# Patient Record
Sex: Female | Born: 1985 | Race: Black or African American | Hispanic: No | Marital: Single
Health system: Southern US, Academic
[De-identification: ages and names within clinical notes are randomized; demographics above are authoritative.]

## PROBLEM LIST (undated history)

## (undated) ENCOUNTER — Ambulatory Visit

## (undated) ENCOUNTER — Telehealth

## (undated) ENCOUNTER — Encounter: Attending: Dermatology | Primary: Dermatology

## (undated) ENCOUNTER — Encounter

## (undated) ENCOUNTER — Ambulatory Visit: Payer: MEDICAID | Attending: Dermatology | Primary: Dermatology

---

## 2017-12-27 ENCOUNTER — Ambulatory Visit: Admit: 2017-12-27 | Discharge: 2017-12-28 | Payer: MEDICAID | Attending: Dermatology | Primary: Dermatology

## 2017-12-27 ENCOUNTER — Other Ambulatory Visit: Admit: 2017-12-27 | Discharge: 2017-12-28 | Payer: MEDICAID

## 2017-12-27 ENCOUNTER — Ambulatory Visit
Admit: 2017-12-27 | Discharge: 2017-12-27 | Disposition: A | Discharge status: Home / Self Care | Payer: MEDICAID | Attending: Emergency Medicine

## 2017-12-27 ENCOUNTER — Emergency Department
Admit: 2017-12-27 | Discharge: 2017-12-27 | Disposition: A | Discharge status: Home / Self Care | Payer: MEDICAID | Attending: Emergency Medicine

## 2017-12-27 DIAGNOSIS — L400 Psoriasis vulgaris: Secondary | ICD-10-CM

## 2017-12-27 DIAGNOSIS — Z79899 Other long term (current) drug therapy: Principal | ICD-10-CM

## 2017-12-27 DIAGNOSIS — R42 Dizziness and giddiness: Secondary | ICD-10-CM

## 2017-12-27 DIAGNOSIS — R110 Nausea: Secondary | ICD-10-CM

## 2017-12-27 DIAGNOSIS — R51 Headache: Principal | ICD-10-CM

## 2017-12-27 MED ORDER — MECLIZINE 25 MG TABLET
ORAL_TABLET | Freq: Three times a day (TID) | ORAL | 0 refills | 0.00000 days | Status: CP | PRN
Start: 2017-12-27 — End: 2018-01-26

## 2017-12-27 MED ORDER — ONDANSETRON 4 MG DISINTEGRATING TABLET T-HOME
ORAL_TABLET | Freq: Three times a day (TID) | ORAL | 0 refills | 0.00000 days | Status: CP | PRN
Start: 2017-12-27 — End: 2018-01-26

## 2017-12-27 MED ORDER — SECUKINUMAB 150 MG/ML SUBCUTANEOUS PEN INJECTOR
SUBCUTANEOUS | 0 refills | 0.00000 days | Status: CP
Start: 2017-12-27 — End: 2018-08-31

## 2017-12-27 MED ORDER — SECUKINUMAB 150 MG/ML SUBCUTANEOUS SYRINGE
SUBCUTANEOUS | 0 refills | 0.00000 days | Status: CP
Start: 2017-12-27 — End: 2018-09-21
  Filled 2018-01-05: qty 8, 28d supply, fill #0

## 2017-12-27 MED ORDER — CALCIPOTRIENE 0.005 % TOPICAL OINTMENT
Freq: Two times a day (BID) | TOPICAL | 4 refills | 0.00000 days
Start: 2017-12-27 — End: 2018-08-31

## 2017-12-28 NOTE — Unmapped (Signed)
Telephoned patient and informed her of good lab results and that cosentyx was approved. She should hear from pharmacy in several days. If not, advised to reach back out to [place].

## 2017-12-29 LAB — QUANTIFERON TB GOLD PLUS
Lab: UNDETERMINED — AB
QUANTIFERON ANTIGEN 1 MINUS NIL: -0.01 [IU]/mL
QUANTIFERON MITOGEN: 0.32 [IU]/mL
QUANTIFERON TB GOLD PLUS: UNDETERMINED — AB

## 2017-12-29 NOTE — Unmapped (Signed)
Telephoned patient and discussed indeterminate result of TB test and that I would like to repeat this. She expressed understanding and will have lab ordered at [place].

## 2017-12-29 NOTE — Unmapped (Signed)
[facility] Specialty Medication Referral: PA APPROVED    Medication (Brand/Generic): COSENTYX    Initial Test Claim completed with resulted information below:  No PA required  Patient ABLE to fill at [facility]  Insurance Company:  [place] MEDICAID  Anticipated Copay: $3  Is anticipated copay with a copay card or grant? NO    As Co-pay is under $25 defined limit, per policy there will be no further investigation of need for financial assistance at this time unless patient requests. This referral has been communicated to the provider and handed off to the [facility] team for further processing and filling of prescribed medication.   ______________________________________________________________________  Please utilize this referral for viewing purposes as it will serve as the central location for all relevant documentation and updates.

## 2017-12-30 ENCOUNTER — Ambulatory Visit: Admit: 2017-12-30 | Discharge: 2017-12-31 | Payer: MEDICAID

## 2017-12-30 DIAGNOSIS — Z79899 Other long term (current) drug therapy: Principal | ICD-10-CM

## 2018-01-03 LAB — QUANTIFERON TB GOLD PLUS
Lab: NEGATIVE
QUANTIFERON ANTIGEN 2 MINUS NIL: -0.19 [IU]/mL
QUANTIFERON TB NIL VALUE: 0.29 [IU]/mL

## 2018-01-03 NOTE — Unmapped (Signed)
[facility]   Patient Onboarding/Medication Counseling    Ms.Tucciarone is a 32 y.o. 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    Medication: Cosenytx    Verified patient's date of birth / HIPAA.      Education Provided: ?    Dose/Administration discussed:  Inject the contents of 2 pen (300mg ) under the skin every week for weeks 0-4, then 2 pens (300mg ) every 4 weeks thereafter. This medication should be taken  without regard to food.  Stressed the importance of taking medication as prescribed and to contact provider if that changes at any time.  Discussed missed dose instructions.    Storage requirements: this medicine should be stored in the refrigerator.     Side effects / precautions discussed: Discussed common side effects, including increased risk of infection, GI upset, injection site reaction. If patient experiences severe skin reaction, fevers/chills they need to call the doctor.  Patient will receive a drug information handout with shipment.    Handling precautions / disposal reviewed:  Patient will dispose of needles in a sharps container or empty laundry detergent bottle.    Drug Interactions: other medications reviewed and up to date in Epic.  No drug interactions identified.    Comorbidities/Allergies: reviewed and up to date in Epic.    Verified therapy is appropriate and should continue      Delivery Information    Medication Assistance provided: Prior Authorization    Anticipated copay of $3 reviewed with patient. Verified delivery address in Epic.    Scheduled delivery date: 01/05/2018    Explained that medication will be delivered by courier. and this shipment will not require a signature.      Explained the services we provide at [facility] and that each month we would call to set up refills.  Stressed importance of returning phone calls so that we could ensure they receive their medications in time each month. Informed patient that we should be setting up refills 7-10 days prior to when they will run out of medication.  Informed patient that welcome packet will be sent.      Patient verbalized understanding of the above information as well as how to contact the pharmacy at [phone] option 4 with any questions/concerns.  The pharmacy is open Monday through Friday 8:30am-4:30pm.  A pharmacist is available 24/7 via pager to answer any clinical questions they may have.        Patient Specific Needs      ? Patient has no physical, cognitive, or cultural barriers.    ? Patient prefers to have medications discussed with  Patient     ? Patient is able to read and understand education materials at a high school level or above.    ? Patient's primary language is  English           Simonne Martinet, PY4    Meghan A. Katrinka Blazing, PharmD - Pharmacist   [facility]   [address], [place], [place] [address]   t [phone] - f [phone]

## 2018-01-04 NOTE — Unmapped (Signed)
Called and left message for patient TB Was negative, Patient verbalizes understanding.

## 2018-01-05 MED FILL — COSENTYX PEN 300 MG/2 PENS (150 MG/ML) SUBCUTANEOUS: 28 days supply | Qty: 8 | Fill #0 | Status: AC

## 2018-02-07 NOTE — Unmapped (Addendum)
[facility] Refill and Clinical Coordination Note  Medication(s): Cosentyx 150mg     Lisa Clements, DOB: 1985/11/29  Phone: [phone] (home) , Alternate phone contact: N/A  Shipping address: [address]  [address]  [place] [address]  Phone or address changes today?: No  All above HIPAA information verified.  Insurance changes? No    Completed refill and clinical call assessment today to schedule patient's medication shipment from the [facility] [phone]).      MEDICATION RECONCILIATION    Confirmed the medication and dosage are correct and have not changed: Yes, regimen is correct and unchanged.    Were there any changes to your medication(s) in the past month:  No, there are no changes reported at this time.    ADHERENCE    Is this medicine transplant or covered by Medicare Part B? No.    Not Applicable    Did you miss any doses in the past 4 weeks? No missed doses reported.  Adherence counseling provided? Not needed     SIDE EFFECT MANAGEMENT    Are you tolerating your medication?:  Lisa Clements reports tolerating the medication.  Side effect management discussed: None      Therapy is appropriate and should be continued.    Evidence of clinical benefit: See Epic note from 12/27/17      FINANCIAL/SHIPPING    Delivery Scheduled: Yes, Expected medication delivery date: 02/20/18     Medication will be delivered via Same Day Courier to the home address in [place].    Additional medications refilled: No additional medications/refills needed at this time.    The patient will receive a drug information handout for each medication shipped and additional FDA Medication Guides as required.      Kynlei did not have any additional questions at this time.    Delivery address confirmed in Epic.     We will follow up with patient monthly for standard refill processing and delivery.      Thank you,  Breck Coons Shared [facility] Specialty Pharmacist

## 2018-02-20 MED ORDER — SECUKINUMAB 150 MG/ML SUBCUTANEOUS PEN INJECTOR
SUBCUTANEOUS | 0 refills | 0.00000 days | Status: CP
Start: 2018-02-20 — End: 2018-04-18

## 2018-02-20 MED ORDER — COSENTYX PEN 300 MG/2 PENS (150 MG/ML) SUBCUTANEOUS
SUBCUTANEOUS | 0 refills | 0.00000 days | Status: CP
Start: 2018-02-20 — End: 2018-09-14
  Filled 2018-02-20: qty 2, 28d supply, fill #0

## 2018-02-20 MED FILL — COSENTYX PEN 300 MG/2 PENS (150 MG/ML) SUBCUTANEOUS: 28 days supply | Qty: 2 | Fill #0 | Status: AC

## 2018-03-13 NOTE — Unmapped (Signed)
Due to the holidays the delivery date of 1/2 is the earliest we can get it to her on a day she is available to accept delivery and a day the courier is running same day deliveries    [facility] Refill Coordination Note    Specialty Medication(s) to be Shipped:   Inflammatory Disorders: Cosentyx    Other medication(s) to be shipped: n/a     Lisa Clements, DOB: Aug 06, 1985  Phone: [phone] (home)       All above HIPAA information was verified with patient.     Completed refill call assessment today to schedule patient's medication shipment from the [facility] [phone]).       Specialty medication(s) and dose(s) confirmed: Regimen is correct and unchanged.   Changes to medications: [name] reports no changes reported at this time.  Changes to insurance: No  Questions for the pharmacist: No    The patient will receive a drug information handout for each medication shipped and additional FDA Medication Guides as required.      DISEASE/MEDICATION-SPECIFIC INFORMATION        For patients on injectable medications: Patient currently has 0 doses left.  Next injection is scheduled for by the end of next week- she will look and see when her last dose was.    SPECIALTY MEDICATION ADHERENCE     Medication Adherence    Patient reported X missed doses in the last month:  0  Specialty Medication:  cosentyx  Patient is on additional specialty medications:  No  Patient is on more than two specialty medications:  No  Any gaps in refill history greater than 2 weeks in the last 3 months:  no  Demonstrates understanding of importance of adherence:  yes  Informant:  patient  Reliability of informant:  reliable              Confirmed plan for next specialty medication refill:  delivery by pharmacy  Refills needed for supportive medications:  not needed          Refill Coordination    Has the Patients' Contact Information Changed:  No  Is the Shipping Address Different:  No cosentyx 150mg /ml: patient has 0 days of medication on hand      SHIPPING     Shipping address confirmed in Epic.     Delivery Scheduled: Yes, Expected medication delivery date: 1/2.     Medication will be delivered via Same Day Courier to the home address in [place].    Renette Butters   [facility] Specialty Technician

## 2018-03-21 MED FILL — COSENTYX PEN 300 MG/2 PENS (150 MG/ML) SUBCUTANEOUS: SUBCUTANEOUS | 28 days supply | Qty: 2 | Fill #1

## 2018-03-21 MED FILL — COSENTYX PEN 300 MG/2 PENS (150 MG/ML) SUBCUTANEOUS: 28 days supply | Qty: 2 | Fill #1 | Status: AC

## 2018-04-18 MED ORDER — SECUKINUMAB 150 MG/ML SUBCUTANEOUS PEN INJECTOR
SUBCUTANEOUS | 0 refills | 0.00000 days | Status: CP
Start: 2018-04-18 — End: 2018-06-07
  Filled 2018-04-26: qty 2, 28d supply, fill #0

## 2018-04-19 NOTE — Unmapped (Signed)
Provided refill x1, needs follow up appt prior to more refills.

## 2018-04-25 NOTE — Unmapped (Signed)
[facility] Refill Coordination Note    Specialty Medication(s) to be Shipped:   Inflammatory Disorders: Cosentyx    Other medication(s) to be shipped: n/a     Lisa Clements, DOB: [birth year]  Phone: [phone] (home)       All above HIPAA information was verified with patient.     Completed refill call assessment today to schedule patient's medication shipment from the [facility] [phone]).       Specialty medication(s) and dose(s) confirmed: Regimen is correct and unchanged.   Changes to medications: Lisa Clements reports no changes reported at this time.  Changes to insurance: No  Questions for the pharmacist: No    The patient will receive a drug information handout for each medication shipped and additional FDA Medication Guides as required.      DISEASE/MEDICATION-SPECIFIC INFORMATION        For patients on injectable medications: Patient currently has 0 doses left.  Next injection is scheduled for 04/28/18.    ADHERENCE              MEDICARE PART B DOCUMENTATION     Not Applicable    SHIPPING     Shipping address confirmed in Epic.     Delivery Scheduled: Yes, Expected medication delivery date: 04/25/18 via UPS or courier.     Medication will be delivered via Same Day Courier to the home address in Epic [place].    Unk Lightning   [facility] Specialty Technician

## 2018-04-26 MED FILL — COSENTYX PEN 300 MG/2 PENS (150 MG/ML) SUBCUTANEOUS: 28 days supply | Qty: 2 | Fill #0 | Status: AC

## 2018-05-16 NOTE — Unmapped (Signed)
[facility] Refill Coordination Note    Specialty Medication(s) to be Shipped:   Inflammatory Disorders: Cosentyx    Other medication(s) to be shipped: na     [address], DOB: Apr 12, 1985  Phone: [phone] (home)       All above HIPAA information was verified with patient.     Completed refill call assessment today to schedule patient's medication shipment from the [facility] [phone]).       Specialty medication(s) and dose(s) confirmed: Regimen is correct and unchanged.   Changes to medications: Cydnee reports no changes reported at this time.  Changes to insurance: No  Questions for the pharmacist: No    Confirmed patient received Welcome Packet with first shipment. The patient will receive a drug information handout for each medication shipped and additional FDA Medication Guides as required.       DISEASE/MEDICATION-SPECIFIC INFORMATION        N/A    SPECIALTY MEDICATION ADHERENCE     Medication Adherence    Patient reported X missed doses in the last month:  0  Specialty Medication:  cosentyx  Patient is on additional specialty medications:  No  Patient is on more than two specialty medications:  No  Any gaps in refill history greater than 2 weeks in the last 3 months:  no  Demonstrates understanding of importance of adherence:  yes  Informant:  patient  Reliability of informant:  reliable  Confirmed plan for next specialty medication refill:  delivery by pharmacy  Refills needed for supportive medications:  not needed          Refill Coordination    Has the Patients' Contact Information Changed:  No  Is the Shipping Address Different:  No           cosentyx injections patient has no medication on hand       SHIPPING     Shipping address confirmed in Epic.     Delivery Scheduled: Yes, Expected medication delivery date: 022820.     Medication will be delivered via Same Day Courier to the home address in Epic [place].    Gianno Volner D Lendon George   [facility] Shared [facility] Specialty Technician

## 2018-05-19 MED FILL — COSENTYX PEN 300 MG/2 PENS (150 MG/ML) SUBCUTANEOUS: 28 days supply | Qty: 2 | Fill #1 | Status: AC

## 2018-05-19 MED FILL — COSENTYX PEN 300 MG/2 PENS (150 MG/ML) SUBCUTANEOUS: 28 days supply | Qty: 2 | Fill #1

## 2018-06-07 DIAGNOSIS — L400 Psoriasis vulgaris: Principal | ICD-10-CM

## 2018-06-08 MED ORDER — SECUKINUMAB 150 MG/ML SUBCUTANEOUS PEN INJECTOR
SUBCUTANEOUS | 0 refills | 0.00000 days | Status: CP
Start: 2018-06-08 — End: 2018-08-31
  Filled 2018-07-12: qty 2, 28d supply, fill #0

## 2018-06-15 NOTE — Unmapped (Signed)
The [facility] has made a second and final attempt to reach this patient to refill the following medication:cosentyx.      We have Left voicemails on the following phone numbers: [phone].    Dates contacted: 3/18 and 3/26  Last scheduled delivery: 05/19/18    The patient may be at risk of non-compliance with this medication. The patient should call the [facility] at [phone] (option 4) to refill medication.    Shoaib Siefker D Administrator [facility] Specialty Technician

## 2018-07-12 MED FILL — COSENTYX PEN 300 MG/2 PENS (150 MG/ML) SUBCUTANEOUS: 28 days supply | Qty: 2 | Fill #0 | Status: AC

## 2018-07-12 NOTE — Unmapped (Signed)
Lisa Clements called in and reported she's starting to flare after missing her dose of Cosentyx last month (she was busy and forgot to call us back). I advised it was OK to get back on maintenance dose, and we could reassess if she still had flare in a month or two, we could reach out to the provider to potentially re-load.    [facility] Clinical Assessment & Refill Coordination Note    Lisa Clements, [name]: [birth year]  Phone: [phone] (home)     All above HIPAA information was verified with patient.     Specialty Medication(s):   Inflammatory Disorders: Cosentyx     Current Outpatient Medications   Medication Sig Dispense Refill   ??? buPROPion (WELLBUTRIN XL) 150 MG 24 hr tablet TAKE 1 TABLET BY MOUTH EVERY DAY FOR MOOD (NEED PRIMARY PAYER PER MEDICAID)  3   ??? calcipotriene (DOVONOX) 0.005 % ointment Apply topically Two (2) times a day. Can mix with cortisone and apply together BID    Large BSA 120 g 4   ??? halobetasol (ULTRAVATE) 0.05 % ointment APPLY TO AFFECTED AREA TWICE A DAY (NEED PRIMARY PAYER PER MEDICAID)  1   ??? secukinumab (COSENTYX) 150 mg/mL PnIj injection Inject 300 mg under the skin at weeks 0, 1, 2, 3, 4 10 mL 0   ??? secukinumab (COSENTYX) 150 mg/mL PnIj injection INJECT THE CONTENTS OF 2 PENS (300 MG) UNDER THE SKIN EVERY 28 DAYS 4 mL 0   ??? secukinumab 150 mg/mL Syrg Inject 300 mg under the skin once every 4 weeks 4 mL 0   ??? secukinumab 150 mg/mL Syrg Inject the contents of 2 syringes (300 mg) under the skin every twenty-eight (28) days. 4 mL 0     No current facility-administered medications for this visit.         Changes to medications: Lisa Clements reports no changes at this time.    No Known Allergies    Changes to allergies: No    SPECIALTY MEDICATION ADHERENCE     Cosentyx - 0 left  Medication Adherence    Patient reported X missed doses in the last month:  all  Specialty Medication:  Cosentyx          Specialty medication(s) dose(s) confirmed: Regimen is correct and unchanged.     Are there any concerns with adherence? Yes: missed last month's dose -got busy    Adherence counseling provided? deferred, patient understands need to stay on treatment regularly    CLINICAL MANAGEMENT AND INTERVENTION      Clinical Benefit Assessment:    Do you feel the medicine is effective or helping your condition? Yes    Clinical Benefit counseling provided? Not needed    Adverse Effects Assessment:    Are you experiencing any side effects? No    Are you experiencing difficulty administering your medicine? No    Quality of Life Assessment:    How many days over the past month did your psoriasis  keep you from your normal activities? For example, brushing your teeth or getting up in the morning. 0    Have you discussed this with your provider? Not needed    Therapy Appropriateness:    Is therapy appropriate? Yes, therapy is appropriate and should be continued    DISEASE/MEDICATION-SPECIFIC INFORMATION      For patients on injectable medications: Patient currently has 0 doses left.  Next injection is scheduled for asap.    PATIENT SPECIFIC NEEDS     ?  Does the patient have any physical, cognitive, or cultural barriers? No    ? Is the patient high risk? No     ? Does the patient require a Care Management Plan? No     ? Does the patient require physician intervention or other additional services (i.e. nutrition, smoking cessation, social work)? No      SHIPPING     Specialty Medication(s) to be Shipped:   Inflammatory Disorders: Cosentyx    Other medication(s) to be shipped: na     Changes to insurance: No    Delivery Scheduled: Yes, Expected medication delivery date: Wed, April 22.     Medication will be delivered via Same Day Courier to the confirmed home address in [facility].    The patient will receive a drug information handout for each medication shipped and additional FDA Medication Guides as required.  Verified that patient has previously received a Conservation officer, historic buildings.    Lanney Gins   [facility] Shared [facility] Specialty Pharmacist

## 2018-08-15 NOTE — Unmapped (Signed)
The [facility] has made a third and final attempt to reach this patient to refill the following medication:Cosentyx.      We have left voicemails on the following phone numbers: [phone].    Dates contacted: 5/14 5/19 5/26  Last scheduled delivery: 4/22    The patient may be at risk of non-compliance with this medication. The patient should call the [facility] at [phone] (option 4) to refill medication.    Travius Crochet D Administrator Shared [facility] Specialty Technician

## 2018-08-22 NOTE — Unmapped (Signed)
[facility] Refill Coordination Note    Specialty Medication(s) to be Shipped:   Inflammatory Disorders: Dupixent    Other medication(s) to be shipped: N/A     Lisa Clements, DOB: [birth year]  Phone: [phone] (home)       All above HIPAA information was verified with patient.     Completed refill call assessment today to schedule patient's medication shipment from the [facility] [phone]).       Specialty medication(s) and dose(s) confirmed: Regimen is correct and unchanged.   Changes to medications: Angeliyah reports no changes at this time.  Changes to insurance: No  Questions for the pharmacist: No    Confirmed patient received Welcome Packet with first shipment. The patient will receive a drug information handout for each medication shipped and additional FDA Medication Guides as required.       DISEASE/MEDICATION-SPECIFIC INFORMATION        For patients on injectable medications: Patient currently has 0 doses left.  Next injection is scheduled for 08/26/18. PATIENT MISSED INJECTION ON 08/12/18. Lisa Clements    SPECIALTY MEDICATION ADHERENCE     Medication Adherence    Patient reported X missed doses in the last month:  1  Specialty Medication:  DUPIXENT. 2 WEEKS LATE ON INJECTION        PATIENT MISSED INJECTION ON 08/12/18 DUE TO LIFE EVENTS DISTRACTING HER. SPOKE WITH HER ABOUT IMPORTANCE OF STAYING ON SCHEDULE. SHE HAS HAD FLARES IN THE PAST COUPLE OF WEEKS.      COSENTYX 150 mg/ml: 0 days of medicine on hand       SHIPPING     Shipping address confirmed in Epic.     Delivery Scheduled: Yes, Expected medication delivery date: 08/24/18.     Medication will be delivered via Same Day Courier to the home address in [place].    Lisa Clements   [facility] Specialty Pharmacist

## 2018-08-24 MED FILL — COSENTYX PEN 300 MG/2 PENS (150 MG/ML) SUBCUTANEOUS: SUBCUTANEOUS | 28 days supply | Qty: 2 | Fill #1

## 2018-08-24 MED FILL — COSENTYX PEN 300 MG/2 PENS (150 MG/ML) SUBCUTANEOUS: 28 days supply | Qty: 2 | Fill #1 | Status: AC

## 2018-08-24 NOTE — Unmapped (Signed)
This patient received a call and there is not any notation on file please call back.    Lisa Clements

## 2018-08-29 NOTE — Unmapped (Signed)
Spoke to pt regarding vv on 6/10, [place] is currently in [place].

## 2018-08-30 ENCOUNTER — Telehealth: Admit: 2018-08-30 | Discharge: 2018-08-31 | Payer: MEDICAID | Attending: Dermatology | Primary: Dermatology

## 2018-08-30 DIAGNOSIS — L400 Psoriasis vulgaris: Principal | ICD-10-CM

## 2018-08-30 NOTE — Unmapped (Signed)
[facility]  Dermatology Video Visit Encounter  Established Patient       Encounter Description/Consent: This encounter was conducted from provider's home via live, face-to-face video conference with the patient. Lisa Clements was located in [place]. Discussed the choice to participate in care through the use of teledermatology by secure video conferencing. Teledermatology enables health care providers at different locations to provide safe, effective, and convenient care through the use of technology. As with any health care service, there are risks associated with the use of teledermatology, including equipment failure, poor image resolution, and information security issues. Also discussed the limitations of a visual physical exam and that there may be instances were they need to come to clinic to complete the assessment. Patient verbally understands the risks and benefits of teledermatology as explained. All questions regarding teledermatology answered.    I spent 15 minutes on the audio/video with the patient. I spent an additional 5 minutes on pre- and post-visit activities.     The patient was physically located in [place] or a state in which I am permitted to provide care. The patient and/or parent/gauardian understood that s/he may incur co-pays and cost sharing, and agreed to the telemedicine visit. The visit was completed via phone and/or video, which was appropriate and reasonable under the circumstances given the patient's presentation at the time.    The patient and/or parent/guardian has been advised of the potential risks and limitations of this mode of treatment (including, but not limited to, the absence of in-person examination) and has agreed to be treated using telemedicine. The patient's/patient's family's questions regarding telemedicine have been answered.     If the phone/video visit was completed in an ambulatory setting, the patient and/or parent/guardian has also been advised to contact their provider???s office for worsening conditions, and seek emergency medical treatment and/or call 911 if the patient deems either necessary.      Assessment/Plan:  Lisa Clements is a 33 y.o. female with a history of psoriasis who is an established patient in [facility] outpatient clinics and is participating in teledermatology follow-up by secure video conferencing.    1. Psoriasis Vulgaris, large BSA--trunk, upper and lower extremities and scalp, BSA ~50%  - Discussed condition, natural evolution and management strategies  - Start halobetasol 0.05% ointment BID prn rashes  - Start dermasmoothe oil to scalp qd   - Start triamcinolone 0.1% ointment to face, skin folds BID prn psoriasis   - Start clobex shampoo 2-3x per week, leave on for 10 minutes then wash off   - Discussed r/b/a of restarting Cosentyx (secukinumab), discussed that we do not know if this places her at higher risk of Coronavirus infection. Due to large BSA and discomfort, she would like to restart medication.   - Start cosentyx 300 mg/mL (inject 2 mL at week 0, 1, 2, 3, 4) and then inject 300 mg (2mL) every 4 weeks   - Discussed that since she is moving to [place], will send a copy of her chart notes to new dermatologist. She will send me information regarding new dermatologist.     RTC: 3 months    [facility] [phone] helpline # is [phone].       Subjective:     Chief Concern:  F/u psoriasis     Interval History:  Patient interviewed in a private place. 33 year old female presents as an established patient for video visit today. She stopped her cosentyx medication in Feb/March 2020 as  she was concerned for contraction of Coronavirus. Since then, she reports increasing plaques or psoriasis on trunk, upper and lower extremities. Had a little ointment left and applying to areas sparingly. Areas on flank are painful and prompting her to have VV with me. She would like to restart injectable medication given great response previously. Denies recent fevers, chills, nausea, vomiting, cough, sob, weight loss, joint pain.       Social History: reviewed; pertinents have been documented in the interval history section.    ROS: 10 point review of systems was reviewed and negative except as mentioned above        Objective:        Medications: reviewed at today's visit    PE:   General: Patient in no acute distress  Neuro: AAO x 3, able to answer all questions appropriately  Skin: erythematous and scaly plaques located on chest, abdomen, flanks, lower back, bilateral extremities       Lorayne Bender, MD  08/30/2018

## 2018-08-31 MED ORDER — CLOBETASOL 0.05 % SHAMPOO
OPHTHALMIC | 4 refills | 0.00000 days | Status: CP
Start: 2018-08-31 — End: ?

## 2018-08-31 MED ORDER — FLUOCINOLONE 0.01 % TOPICAL BODY OIL
TOPICAL | 5 refills | 0.00000 days | Status: CP
Start: 2018-08-31 — End: 2018-08-31

## 2018-08-31 MED ORDER — SECUKINUMAB 150 MG/ML SUBCUTANEOUS PEN INJECTOR
SUBCUTANEOUS | 0 refills | 0.00000 days | Status: CP
Start: 2018-08-31 — End: 2018-09-21

## 2018-08-31 MED ORDER — HALOBETASOL PROPIONATE 0.05 % TOPICAL OINTMENT
Freq: Two times a day (BID) | TOPICAL | 5 refills | 0.00000 days | Status: CP
Start: 2018-08-31 — End: ?

## 2018-08-31 MED ORDER — FLUOCINOLONE 0.01 % TOPICAL BODY OIL: mL | 5 refills | 0 days | Status: AC

## 2018-08-31 MED ORDER — TRIAMCINOLONE ACETONIDE 0.1 % TOPICAL OINTMENT
Freq: Two times a day (BID) | TOPICAL | 8 refills | 0.00000 days | Status: CP
Start: 2018-08-31 — End: 2019-08-31

## 2018-08-31 MED ORDER — SECUKINUMAB 150 MG/ML SUBCUTANEOUS PEN INJECTOR: mL | 0 refills | 0 days | Status: AC

## 2018-08-31 NOTE — Unmapped (Signed)
Received PA requests for fluocinolone 0.01 % external oil and clobetasol shampoo. Called pharmacy to ask them to rekey these medications as the brand names as these are the ones on the approved drug list. Pharmacist ran them as such and they were covered.

## 2018-09-13 ENCOUNTER — Ambulatory Visit
Admit: 2018-09-13 | Discharge: 2018-09-14 | Payer: MEDICAID | Attending: Nurse Practitioner | Primary: Nurse Practitioner

## 2018-09-13 DIAGNOSIS — F3341 Major depressive disorder, recurrent, in partial remission: Secondary | ICD-10-CM

## 2018-09-13 DIAGNOSIS — F419 Anxiety disorder, unspecified: Principal | ICD-10-CM

## 2018-09-13 MED ORDER — BUPROPION HCL XL 150 MG 24 HR TABLET, EXTENDED RELEASE
ORAL_TABLET | Freq: Every morning | ORAL | 0 refills | 0.00000 days | Status: CP
Start: 2018-09-13 — End: 2018-10-28

## 2018-09-13 MED ORDER — HYDROXYZINE PAMOATE 25 MG CAPSULE
ORAL_CAPSULE | Freq: Four times a day (QID) | ORAL | 0 refills | 0.00000 days | Status: CP | PRN
Start: 2018-09-13 — End: ?

## 2018-09-13 NOTE — Unmapped (Signed)
Subjective:      Lisa Clements is a 33 y.o. female who presents for evaluation of mood problems. Current symptoms include depressed mood and fatigue and has been present for 4 months and symptoms have been gradual worsening. she also notes the following anxiety symptoms: fatigue, racing thoughts. she is currently taking nothing, states last wellbutrin was in February.  Patient denies psychomotor agitation, recurrent thoughts of death, suicidal attempt, suicidal thoughts with specific plan, suicidal thoughts without plan and weight loss.     Previously on medications: yes - Wellbutrin worked well per patient  Seeing a Counselor: no  Previous diagnosis of psychiatric disorders: yes - depression and anxiety  Family history of psychiatric disorders: no       The following portions of the patient's history were reviewed and updated as appropriate: allergies, current medications, past family history, past medical history, past social history, past surgical history and problem list.    Review of Systems:     As noted in HPI. All other ROS negative.     Outpatient Medications Prior to Visit   Medication Sig Dispense Refill   ??? clobetasoL (CLOBEX) 0.05 % shampoo Apply 2x/week, lather onto scalp and wash off after 10 minutes 118 mL 4   ??? fluocinolone 0.01 % external oil Apply oil qd prn psoriasis to scalp after shaking bottle. 120 mL 5   ??? halobetasol (ULTRAVATE) 0.05 % ointment APPLY TO AFFECTED AREA TWICE A DAY (NEED PRIMARY PAYER PER MEDICAID)  1   ??? halobetasol (ULTRAVATE) 0.05 % ointment Apply topically Two (2) times a day. For psoriasis. LARGE BSA 180 g 5   ??? secukinumab (COSENTYX) 150 mg/mL PnIj injection Inject 300 mg under the skin at weeks 0, 1, 2, 3, 4 10 mL 0   ??? secukinumab (COSENTYX) 150 mg/mL PnIj injection INJECT THE CONTENTS OF 2 PENS (300 MG) UNDER THE SKIN EVERY 28 DAYS 10 mL 0   ??? secukinumab 150 mg/mL Syrg Inject 300 mg under the skin once every 4 weeks 4 mL 0   ??? secukinumab (COSENTYX PEN, 2 PENS,) 150 mg/mL PnIj injection Inject 2 mL (300 mg total) under the skin every twenty-eight (28) days. 4 mL 0   ??? secukinumab 150 mg/mL Syrg Inject 300 mg under the skin.     ??? triamcinolone (KENALOG) 0.1 % ointment Apply topically Two (2) times a day. To rashes 454 g 8   ??? buPROPion (WELLBUTRIN XL) 150 MG 24 hr tablet Take 150 mg by mouth.       No facility-administered medications prior to visit.          Objective:     Vitals:    09/13/18 1411   BP: 155/104   Pulse: 88   Resp: 18   Temp: 36.6 ??C (97.9 ??F)   SpO2: 100%      General:   alert, appears stated age, cooperative and no distress   Affect & Behavior:   full facial expressions, good grooming, good insight, normal perception, normal reasoning, normal speech pattern and content and normal thought patterns none present except tearful when discussing symptoms   Heart:    regular rate and rhythm, no ectopy   Lungs:  clear to auscultation bilaterally         Assessment:      Diagnosis ICD-10-CM Associated Orders   1. Anxiety F41.9    2. 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 Medication: begin Wellbutrin XL again  Outpatient Encounter Medications as of 09/13/2018   Medication Sig Dispense Refill   ??? buPROPion (WELLBUTRIN XL) 150 MG 24 hr tablet Take 1 tablet (150 mg total) by mouth every morning. 45 tablet 0   ??? clobetasoL (CLOBEX) 0.05 % shampoo Apply 2x/week, lather onto scalp and wash off after 10 minutes 118 mL 4   ??? fluocinolone 0.01 % external oil Apply oil qd prn psoriasis to scalp after shaking bottle. 120 mL 5   ??? halobetasol (ULTRAVATE) 0.05 % ointment APPLY TO AFFECTED AREA TWICE A DAY (NEED PRIMARY PAYER PER MEDICAID)  1   ??? halobetasol (ULTRAVATE) 0.05 % ointment Apply topically Two (2) times a day. For psoriasis. LARGE BSA 180 g 5   ??? hydrOXYzine (VISTARIL) 25 MG capsule Take 1 capsule (25 mg total) by mouth 4 (four) times a day as needed for up to 90 doses. 90 capsule 0   ??? secukinumab (COSENTYX) 150 mg/mL PnIj injection Inject 300 mg under the skin at weeks 0, 1, 2, 3, 4 10 mL 0   ??? secukinumab (COSENTYX) 150 mg/mL PnIj injection INJECT THE CONTENTS OF 2 PENS (300 MG) UNDER THE SKIN EVERY 28 DAYS 10 mL 0   ??? secukinumab 150 mg/mL Syrg Inject 300 mg under the skin once every 4 weeks 4 mL 0   ??? secukinumab (COSENTYX PEN, 2 PENS,) 150 mg/mL PnIj injection Inject 2 mL (300 mg total) under the skin every twenty-eight (28) days. 4 mL 0   ??? secukinumab 150 mg/mL Syrg Inject 300 mg under the skin.     ??? triamcinolone (KENALOG) 0.1 % ointment Apply topically Two (2) times a day. To rashes 454 g 8   ??? [DISCONTINUED] buPROPion (WELLBUTRIN XL) 150 MG 24 hr tablet Take 150 mg by mouth.       No facility-administered encounter medications on file as of 09/13/2018.      No orders of the defined types were placed in this encounter.      2. Medications: Wellbutrin.     3. Return to clinic in as needed before PCP established in [place] for re-evaluation and assess response to treatment.    Greater than 25 min spent on today's visit, at least 50% of which were in face to face counseling and coordination of care.    Advised patient to follow up with their PCP this week or if symptoms worsen return here or go to the emergency department for evaluation.    The patient is advised to follow up with their PCP for health maintenance and chronic illness management.     Previsit planning was completed via snapshot and review of chart.   Patient/family verbalized to me that they understood what their problem is, what they need to do about it, and why it is important that they do it.   The patient/family voices understanding of all medications. No barriers to adherence were noted. Patient is taking all medications as prescribed and is tolerating well.   Plan for follow-up as discussed or as needed if any worsening symptoms or change in condition.   After Visit Summary was given to patient.

## 2018-09-13 NOTE — Unmapped (Signed)
Patient Education        Anxiety Disorder: Care Instructions  Your Care Instructions     Anxiety is a normal reaction to stress. Difficult situations can cause you to have symptoms such as sweaty palms and a nervous feeling.  In an anxiety disorder, the symptoms are far more severe. Constant worry, muscle tension, trouble sleeping, nausea and diarrhea, and other symptoms can make normal daily activities difficult or impossible. These symptoms may occur for no reason, and they can affect your work, school, or social life. Medicines, counseling, and self-care can all help.  Follow-up care is a key part of your treatment and safety. Be sure to make and go to all appointments, and call your doctor if you are having problems. It's also a good idea to know your test results and keep a list of the medicines you take.  How can you care for yourself at home?  ?? Take medicines exactly as directed. Call your doctor if you think you are having a problem with your medicine.  ?? Go to your counseling sessions and follow-up appointments.  ?? Recognize and accept your anxiety. Then, when you are in a situation that makes you anxious, say to yourself, This is not an emergency. I feel uncomfortable, but I am not in danger. I can keep going even if I feel anxious.  ?? Be kind to your body:  ? Relieve tension with exercise or a massage.  ? Get enough rest.  ? Avoid alcohol, caffeine, nicotine, and illegal drugs. They can increase your anxiety level and cause sleep problems.  ? Learn and do relaxation techniques. See below for more about these techniques.  ?? Engage your mind. Get out and do something you enjoy. Go to a funny movie, or take a walk or hike. Plan your day. Having too much or too little to do can make you anxious.  ?? Keep a record of your symptoms. Discuss your fears with a good friend or family member, or join a support group for people with similar problems. Talking to others sometimes relieves stress.  ?? Get involved in social groups, or volunteer to help others. Being alone sometimes makes things seem worse than they are.  ?? Get at least 30 minutes of exercise on most days of the week to relieve stress. Walking is a good choice. You also may want to do other activities, such as running, swimming, cycling, or playing tennis or team sports.  Relaxation techniques  Do relaxation exercises 10 to 20 minutes a day. You can play soothing, relaxing music while you do them, if you wish.  ?? Tell others in your house that you are going to do your relaxation exercises. Ask them not to disturb you.  ?? Find a comfortable place, away from all distractions and noise.  ?? Lie down on your back, or sit with your back straight.  ?? Focus on your breathing. Make it slow and steady.  ?? Breathe in through your nose. Breathe out through either your nose or mouth.  ?? Breathe deeply, filling up the area between your navel and your rib cage. Breathe so that your belly goes up and down.  ?? Do not hold your breath.  ?? Breathe like this for 5 to 10 minutes. Notice the feeling of calmness throughout your whole body.  As you continue to breathe slowly and deeply, relax by doing the following for another 5 to 10 minutes:  ?? Tighten and relax each muscle group in   your body. You can begin at your toes and work your way up to your head.  ?? Imagine your muscle groups relaxing and becoming heavy.  ?? Empty your mind of all thoughts.  ?? Let yourself relax more and more deeply.  ?? Become aware of the state of calmness that surrounds you.  ?? When your relaxation time is over, you can bring yourself back to alertness by moving your fingers and toes and then your hands and feet and then stretching and moving your entire body. Sometimes people fall asleep during relaxation, but they usually wake up shortly afterward.  ?? Always give yourself time to return to full alertness before you drive a car or do anything that might cause an accident if you are not fully alert. Never play a relaxation tape while you drive a car.  When should you call for help?   ZOXW960 anytime you think you may need emergency care. For example, call if:  ?? You feel you cannot stop from hurting yourself or someone else.  Keep the numbers for these national suicide hotlines: 1-800-273-TALK 469-163-2902) and 1-800-SUICIDE 321-725-4152). If you or someone you know talks about suicide or feeling hopeless, get help right away.  Watch closely for changes in your health, and be sure to contact your doctor if:  ?? You have anxiety or fear that affects your life.  ?? You have symptoms of anxiety that are new or different from those you had before.  Where can you learn more?  Go to [facility] at https://myuncchart.org  [facility] under [employer]. Enter P754 in the search box to learn more about Anxiety Disorder: Care Instructions.  Current as of: April 21, 2018??????????????????????????????Content Version: 12.5  ?? 2006-2020 Healthwise, Inc[employer].   Care instructions adapted under license by [facility]. If you have questions about a medical condition or this instruction, always ask your healthcare professional. Healthwise, Incorporated disclaims any warranty or liability for your use of this information.

## 2018-09-14 MED ORDER — COSENTYX PEN 300 MG/2 PENS (150 MG/ML) SUBCUTANEOUS
SUBCUTANEOUS | 0 refills | 0.00000 days | Status: CP
Start: 2018-09-14 — End: ?
  Filled 2018-09-19: qty 2, 28d supply, fill #0

## 2018-09-14 NOTE — Unmapped (Signed)
Pt is requesting medication refill. Currently pending in chart. Pt has been seen within a year.     [place], [place]

## 2018-09-18 NOTE — Unmapped (Signed)
[facility] Refill Coordination Note    Specialty Medication(s) to be Shipped:   Inflammatory Disorders: Cosentyx    Other medication(s) to be shipped: none     [address] Lisa Clements, DOB: 1985-03-30  Phone: [phone] (home)       All above HIPAA information was verified with patient.     Completed refill call assessment today to schedule patient's medication shipment from the [facility] [phone]).       Specialty medication(s) and dose(s) confirmed: Regimen is correct and unchanged.   Changes to medications: Lisa Clements reports no changes reported at this time.  Changes to insurance: No  Questions for the pharmacist: No    Confirmed patient received Welcome Packet with first shipment. The patient will receive a drug information handout for each medication shipped and additional FDA Medication Guides as required.       DISEASE/MEDICATION-SPECIFIC INFORMATION        For patients on injectable medications: Patient currently has 0 doses left.  Next injection is scheduled for ~09/22/18.    SPECIALTY MEDICATION ADHERENCE     Medication Adherence    Patient reported X missed doses in the last month:  0  Specialty Medication:  cosentyx 150 mg/ml  Patient is on additional specialty medications:  No  Patient is on more than two specialty medications:  No                Cosentyx 150 mg/ml: 0 days of medicine on hand         SHIPPING     Shipping address confirmed in Epic.     Delivery Scheduled: Yes, Expected medication delivery date: 09/19/18.     Medication will be delivered via Same Day Courier to the prescription address in Epic WAM.    Arnold Long   [facility] Specialty Pharmacist

## 2018-09-19 MED FILL — COSENTYX PEN 300 MG/2 PENS (150 MG/ML) SUBCUTANEOUS: 28 days supply | Qty: 2 | Fill #0 | Status: AC

## 2018-09-20 NOTE — Unmapped (Signed)
[facility] left a VM asking for TB test results and also asking if Lisa Clements needed a loading dose. I called back to give the negative Quantiferon Gold results from October 2019. I also told [facility] that this was a restart as of 08/31/18 so would need loading dose if they had not mailed any out yet. [facility] is going to [facility] because the [facility] was originally sent [facility]. They have also reached out to Lisa Clements to find out if she received her loading dose.

## 2018-09-21 MED ORDER — SECUKINUMAB 150 MG/ML SUBCUTANEOUS PEN INJECTOR
SUBCUTANEOUS | 0 refills | 0.00000 days | Status: CP
Start: 2018-09-21 — End: ?

## 2018-09-21 NOTE — Unmapped (Signed)
Unable to initiate PA in [employer] due to pt Medicaid is not valid.   Pt informed via MyChart and asked tosend updated insurance if available.

## 2018-09-21 NOTE — Unmapped (Signed)
Call from [facility] pharmacy.  They state a PA is required for Cosentyx.

## 2018-09-21 NOTE — Unmapped (Signed)
Hi Dr. Izora Ribas,    Some how there was a mix up in the Cosentyx prescriptions. They were [facility] a local [facility]. The maintenance dose was transferred to [facility], but they never received the loading dose. I have pended the loading dose to be sent again. Thanks!    Adaleigh Warf

## 2018-09-25 NOTE — Unmapped (Signed)
Call from [facility].  Requires PA for Cosentyx pen via [employer].

## 2018-10-13 NOTE — Unmapped (Signed)
Signature needed on PA paperwork for cosentyx. I have placed it in your mailbox.

## 2018-10-18 NOTE — Unmapped (Signed)
PA initiated for cosentyx via CMM. Key is [id number].

## 2018-10-19 NOTE — Unmapped (Signed)
Filled out and faxed additional information to [facility].

## 2018-10-23 NOTE — Unmapped (Signed)
The [facility] has made a third and final attempt to reach this patient to refill the following medication:Cosentyx.      We have left voicemails on the following phone numbers: [phone].    Dates contacted: 7/22 7/28 8/3  Last scheduled delivery: 6/30    The patient may be at risk of non-compliance with this medication. The patient should call the [facility] at [phone] (option 4) to refill medication.    Suzie Vandam D Administrator [facility] Specialty Technician

## 2018-10-23 NOTE — Unmapped (Signed)
Additional PA paperwork filled out for Cosentyx and faxed to [facility].

## 2018-10-25 NOTE — Unmapped (Signed)
PA denial for cosentyx. I believe she tried Enbrel, but not Humira. Her insurance wants her to try both. Scanned into media.

## 2018-11-29 ENCOUNTER — Encounter (HOSPITAL_COMMUNITY): Payer: Self-pay | Admitting: Emergency Medicine

## 2018-11-29 ENCOUNTER — Other Ambulatory Visit: Payer: Self-pay

## 2018-11-29 ENCOUNTER — Emergency Department (HOSPITAL_COMMUNITY)
Admission: EM | Admit: 2018-11-29 | Discharge: 2018-11-30 | Discharge status: Against Medical Advice | Payer: Medicaid - Out of State | Attending: Emergency Medicine | Admitting: Emergency Medicine

## 2018-11-29 ENCOUNTER — Emergency Department (HOSPITAL_COMMUNITY): Payer: Medicaid - Out of State

## 2018-11-29 DIAGNOSIS — Z5321 Procedure and treatment not carried out due to patient leaving prior to being seen by health care provider: Secondary | ICD-10-CM | POA: Diagnosis not present

## 2018-11-29 DIAGNOSIS — R0602 Shortness of breath: Secondary | ICD-10-CM | POA: Diagnosis present

## 2018-11-29 NOTE — ED Triage Notes (Signed)
Patient reports SOB with chest tightness onset last night , denies emesis or diaphoresis , no cough or congestion .

## 2018-11-30 LAB — CBC WITH DIFFERENTIAL/PLATELET
Abs Immature Granulocytes: 0.02 10*3/uL (ref 0.00–0.07)
Basophils Absolute: 0 10*3/uL (ref 0.0–0.1)
Basophils Relative: 1 %
Eosinophils Absolute: 0.1 10*3/uL (ref 0.0–0.5)
Eosinophils Relative: 2 %
HCT: 37.3 % (ref 36.0–46.0)
Hemoglobin: 11.8 g/dL — ABNORMAL LOW (ref 12.0–15.0)
Immature Granulocytes: 0 %
Lymphocytes Relative: 28 %
Lymphs Abs: 2.5 10*3/uL (ref 0.7–4.0)
MCH: 30.2 pg (ref 26.0–34.0)
MCHC: 31.6 g/dL (ref 30.0–36.0)
MCV: 95.4 fL (ref 80.0–100.0)
Monocytes Absolute: 0.5 10*3/uL (ref 0.1–1.0)
Monocytes Relative: 6 %
Neutro Abs: 5.6 10*3/uL (ref 1.7–7.7)
Neutrophils Relative %: 63 %
Platelets: 237 10*3/uL (ref 150–400)
RBC: 3.91 MIL/uL (ref 3.87–5.11)
RDW: 12.8 % (ref 11.5–15.5)
WBC: 8.8 10*3/uL (ref 4.0–10.5)
nRBC: 0 % (ref 0.0–0.2)

## 2018-11-30 LAB — BASIC METABOLIC PANEL
Anion gap: 9 (ref 5–15)
BUN: 9 mg/dL (ref 6–20)
CO2: 21 mmol/L — ABNORMAL LOW (ref 22–32)
Calcium: 8.4 mg/dL — ABNORMAL LOW (ref 8.9–10.3)
Chloride: 103 mmol/L (ref 98–111)
Creatinine, Ser: 0.86 mg/dL (ref 0.44–1.00)
GFR calc Af Amer: >60 mL/min (ref >60)
GFR calc non Af Amer: >60 mL/min (ref >60)
Glucose, Bld: 107 mg/dL — ABNORMAL HIGH (ref 70–99)
Potassium: 4.5 mmol/L (ref 3.5–5.1)
Sodium: 133 mmol/L — ABNORMAL LOW (ref 135–145)

## 2018-11-30 LAB — I-STAT BETA HCG BLOOD, ED (MC, WL, AP ONLY): I-stat hCG, quantitative: <5 m[IU]/mL (ref <5)

## 2018-11-30 LAB — TROPONIN I (HIGH SENSITIVITY): Troponin I (High Sensitivity): <2 ng/L (ref <18)

## 2018-11-30 NOTE — ED Notes (Signed)
Called for pt x2 for vitals. No answer. Stickers found on bench.

## 2019-01-15 NOTE — Unmapped (Signed)
This patient has been disenrolled from the [facility] specialty pharmacy services due to inability of the pharmacy to reach the patient. See previous note for call attempts. Last medication refill on 09/19/18.         Lanney Gins  [facility] Specialty Pharmacist

## 2021-04-04 IMAGING — CR DG CHEST 2V
2 series · 2 of 2 positions shown · non-contrast
Comparison: None

CLINICAL DATA: 33-year-old female with shortness of breath

EXAM:
CHEST - 2 VIEW

[chest lat]
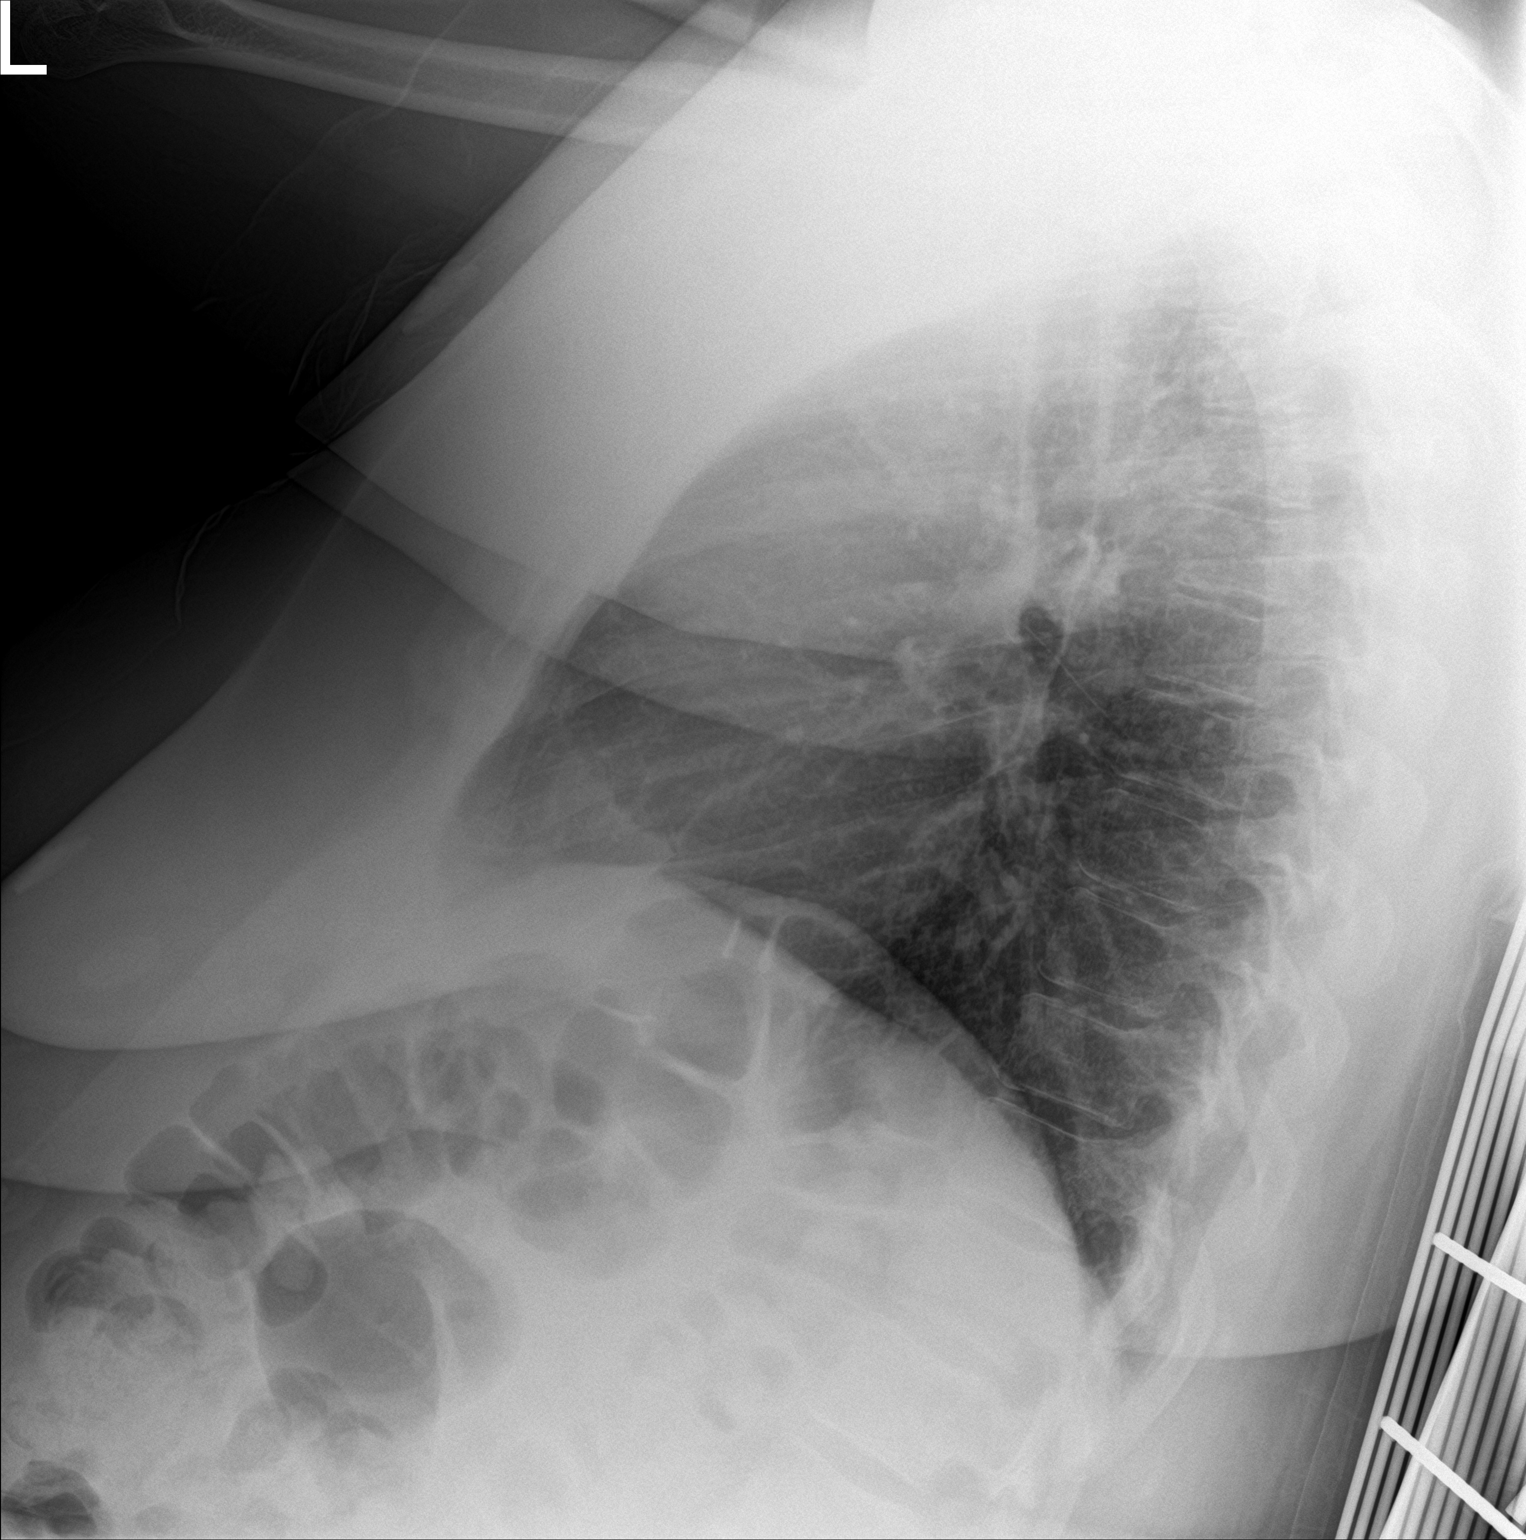

[chest ap]
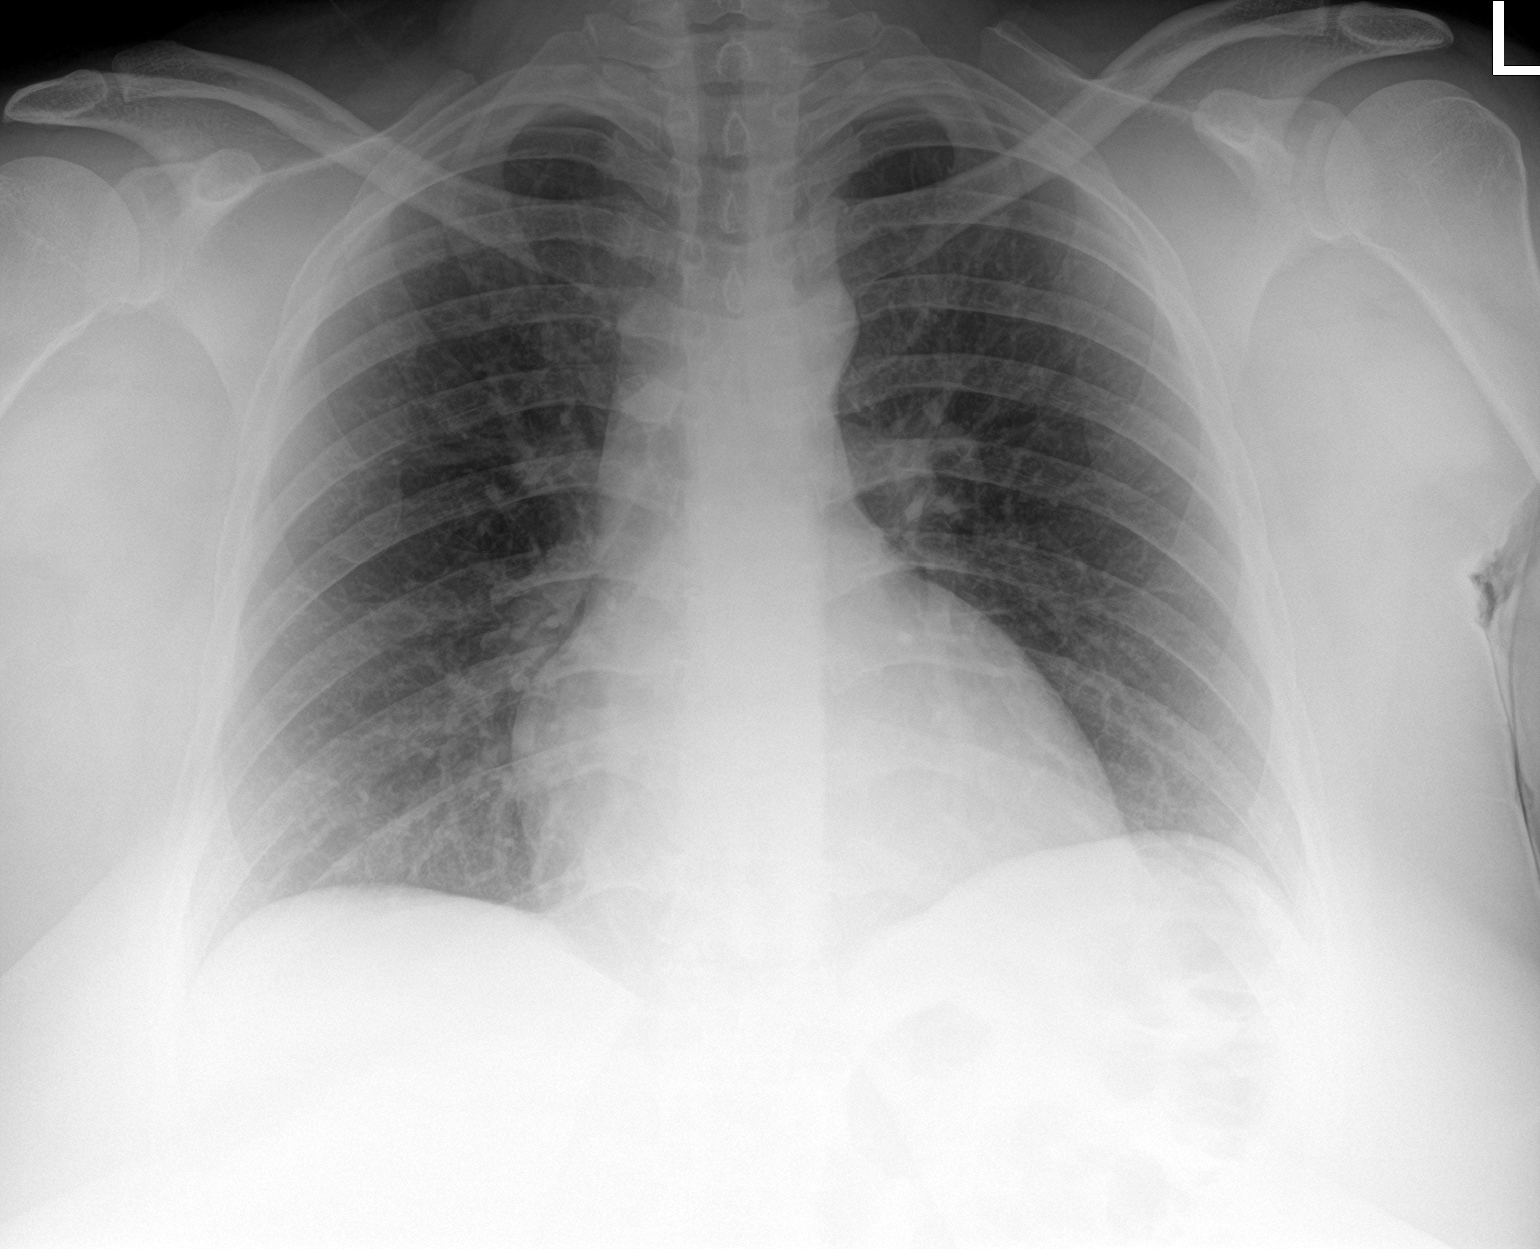

[2 of 2 positions shown; findings below may reference images not displayed]

FINDINGS: The lungs are clear. There is no pleural effusion or pneumothorax.
Top-normal cardiac size. No acute osseous pathology.
IMPRESSION: No active cardiopulmonary disease.
